# Patient Record
Sex: Male | Born: 1945 | Marital: Married | Smoking: Never smoker
Health system: Southern US, Community
[De-identification: ages and names within clinical notes are randomized; demographics above are authoritative.]

## PROBLEM LIST (undated history)

## (undated) DIAGNOSIS — T7840XA Allergy, unspecified, initial encounter: Secondary | ICD-10-CM

## (undated) DIAGNOSIS — N200 Calculus of kidney: Secondary | ICD-10-CM

## (undated) DIAGNOSIS — I10 Essential (primary) hypertension: Secondary | ICD-10-CM

## (undated) DIAGNOSIS — E079 Disorder of thyroid, unspecified: Secondary | ICD-10-CM

## (undated) DIAGNOSIS — C61 Malignant neoplasm of prostate: Secondary | ICD-10-CM

## (undated) HISTORY — DX: Essential (primary) hypertension: I10

## (undated) HISTORY — PX: PROSTATE BIOPSY: SHX241

## (undated) HISTORY — DX: Calculus of kidney: N20.0

## (undated) HISTORY — DX: Malignant neoplasm of prostate: C61

## (undated) HISTORY — DX: Disorder of thyroid, unspecified: E07.9

## (undated) HISTORY — DX: Allergy, unspecified, initial encounter: T78.40XA

---

## 1981-11-14 DIAGNOSIS — N200 Calculus of kidney: Secondary | ICD-10-CM

## 1981-11-14 HISTORY — DX: Calculus of kidney: N20.0

## 2005-11-14 HISTORY — PX: POLYPECTOMY: SHX149

## 2005-11-14 HISTORY — PX: COLONOSCOPY: SHX174

## 2011-01-27 ENCOUNTER — Other Ambulatory Visit: Payer: Self-pay | Admitting: Internal Medicine

## 2011-01-27 DIAGNOSIS — E038 Other specified hypothyroidism: Secondary | ICD-10-CM

## 2011-02-28 ENCOUNTER — Encounter (HOSPITAL_COMMUNITY)
Admission: RE | Admit: 2011-02-28 | Discharge: 2011-02-28 | Disposition: A | Discharge status: Home / Self Care | Payer: Medicare Other | Source: Ambulatory Visit | Attending: Internal Medicine | Admitting: Internal Medicine

## 2011-02-28 DIAGNOSIS — R946 Abnormal results of thyroid function studies: Secondary | ICD-10-CM | POA: Insufficient documentation

## 2011-02-28 DIAGNOSIS — E038 Other specified hypothyroidism: Secondary | ICD-10-CM

## 2011-03-01 ENCOUNTER — Encounter (HOSPITAL_COMMUNITY)
Admission: RE | Admit: 2011-03-01 | Discharge: 2011-03-01 | Disposition: A | Discharge status: Home / Self Care | Payer: Medicare Other | Source: Ambulatory Visit | Attending: Internal Medicine | Admitting: Internal Medicine

## 2011-03-01 DIAGNOSIS — R946 Abnormal results of thyroid function studies: Secondary | ICD-10-CM | POA: Insufficient documentation

## 2011-03-01 MED ORDER — TECHNETIUM TC 99M MEBROFENIN IV KIT
5.0000 | PACK | Freq: Once | INTRAVENOUS | Status: AC | PRN
  Administered 2011-03-01: 5 via INTRAVENOUS

## 2011-03-21 ENCOUNTER — Other Ambulatory Visit: Payer: Self-pay | Admitting: Cardiovascular Disease

## 2011-03-21 ENCOUNTER — Ambulatory Visit
Admission: RE | Admit: 2011-03-21 | Discharge: 2011-03-21 | Disposition: A | Discharge status: Home / Self Care | Payer: Medicare Other | Source: Ambulatory Visit | Attending: Cardiovascular Disease | Admitting: Cardiovascular Disease

## 2011-03-21 DIAGNOSIS — I10 Essential (primary) hypertension: Secondary | ICD-10-CM

## 2011-09-06 ENCOUNTER — Encounter: Payer: Self-pay | Admitting: Internal Medicine

## 2011-10-19 ENCOUNTER — Telehealth: Payer: Self-pay | Admitting: *Deleted

## 2011-10-19 ENCOUNTER — Encounter: Payer: Self-pay | Admitting: Internal Medicine

## 2011-10-19 ENCOUNTER — Ambulatory Visit (AMBULATORY_SURGERY_CENTER): Payer: Medicare Other | Admitting: *Deleted

## 2011-10-19 VITALS — Ht 70.0 in | Wt 179.9 lb

## 2011-10-19 DIAGNOSIS — Z1211 Encounter for screening for malignant neoplasm of colon: Secondary | ICD-10-CM

## 2011-10-19 MED ORDER — PEG-KCL-NACL-NASULF-NA ASC-C 100 G PO SOLR: ORAL | Status: DC

## 2011-10-19 NOTE — Telephone Encounter (Signed)
Pt had colonoscopy 2007 at [facility] in [place], [place]. Family hx of colon cancer in mother.  Pt had polyps.  Pt brought procedure report but not pathology report.  Release of information form signed and given to Alonna Buckler, CMA.  Pt  Scheduled for colonoscopy 11/17/2011. Ezra Sites

## 2011-10-19 NOTE — Progress Notes (Signed)
Pt had colonoscopy 2007 at [facility] in [place], [place]. Family hx of colon cancer in mother.  Pt had polyps.  Pt brought procedure report but not pathology report.  Release of information form signed and given to Alonna Buckler, CMA.  Pt  Scheduled for colonoscopy 11/17/2011.

## 2011-11-02 ENCOUNTER — Other Ambulatory Visit: Payer: Medicare Other | Admitting: Internal Medicine

## 2011-11-17 ENCOUNTER — Encounter: Payer: Medicare Other | Admitting: Internal Medicine

## 2012-01-04 ENCOUNTER — Ambulatory Visit (AMBULATORY_SURGERY_CENTER): Payer: Medicare Other | Admitting: Internal Medicine

## 2012-01-04 ENCOUNTER — Encounter: Payer: Self-pay | Admitting: Internal Medicine

## 2012-01-04 VITALS — BP 160/80 | HR 91 | Temp 96.1°F | Resp 15 | Ht 70.0 in | Wt 179.0 lb

## 2012-01-04 DIAGNOSIS — D126 Benign neoplasm of colon, unspecified: Secondary | ICD-10-CM

## 2012-01-04 DIAGNOSIS — Z1211 Encounter for screening for malignant neoplasm of colon: Secondary | ICD-10-CM

## 2012-01-04 DIAGNOSIS — Z800 Family history of malignant neoplasm of digestive organs: Secondary | ICD-10-CM

## 2012-01-04 DIAGNOSIS — Z86010 Personal history of colonic polyps: Secondary | ICD-10-CM

## 2012-01-04 MED ORDER — SODIUM CHLORIDE 0.9 % IV SOLN: 500.0000 mL | INTRAVENOUS | Status: DC

## 2012-01-04 NOTE — Progress Notes (Signed)
Propofol per s camp crna per protocol. See scanned intra procedure report. ewm 

## 2012-01-04 NOTE — Op Note (Signed)
[facility] [address] [place], [place]  [address]  COLONOSCOPY PROCEDURE REPORT  PATIENT:  Allen Jackson, Allen Jackson  MR#:  [id number] BIRTHDATE:  Nov 06, 1946, 66 yrs. old  GENDER:  male ENDOSCOPIST:  Jahzeel Poythress. Eda Keys, MD REF. BY:  Chilton Greathouse, M.D. PROCEDURE DATE:  01/04/2012 PROCEDURE:  Colonoscopy with snare polypectomy x 4 ASA CLASS:  Class II INDICATIONS:  history of polyps, surveillance and high-risk screening, family history of colon cancer ; index exam [facility] (Dr Enrigue Catena) 06-13-06 w/ 7mm sessile cecal polyp (?path) MEDICATIONS:   MAC sedation, administered by CRNA, propofol (Diprivan) 400 mg IV  DESCRIPTION OF PROCEDURE:   After the risks benefits and alternatives of the procedure were thoroughly explained, informed consent was obtained.  Digital rectal exam was performed and revealed no abnormalities.   The LB CF-[id number] endoscope was introduced through the anus and advanced to the cecum, which was identified by both the appendix and ileocecal valve, without limitations.  The quality of the prep was excellent, using MoviPrep.  The instrument was then slowly withdrawn as the colon was fully examined. <<PROCEDUREIMAGES>>  FINDINGS:  Four polyps were found - 6mm in the cecum, 2mm in transverse, 3mm in sigmoid and 5mm in rectum. Polyps were snared without cautery. Retrieval was successful. Otherwise normal colonoscopy without other polyps, masses, vascular ectasias, or inflammatory changes.   Retroflexed views in the rectum revealed no abnormalities.    The time to cecum = 1:33  minutes. The scope was then withdrawn in 16:39  minutes from the cecum and the procedure completed.  COMPLICATIONS:  None  ENDOSCOPIC IMPRESSION: 1) Four polyps - removed 2) Otherwise normal colonoscopy  RECOMMENDATIONS: 1) Follow up colonoscopy in 5 years  ______________________________ Wilhemina Bonito. Eda Keys, MD  CC:  Chilton Greathouse, MD;  The Patient  n. eSIGNED:   Wilhemina Bonito.  Eda Keys at 01/04/2012 10:56 AM  Claria Dice, [phone]

## 2012-01-04 NOTE — Patient Instructions (Signed)
YOU HAD AN ENDOSCOPIC PROCEDURE TODAY AT THE [facility]: Refer to the procedure report that was given to you for any specific questions about what was found during the examination.  If the procedure report does not answer your questions, please call your gastroenterologist to clarify.  If you requested that your care partner not be given the details of your procedure findings, then the procedure report has been included in a sealed envelope for you to review at your convenience later.  YOU SHOULD EXPECT: Some feelings of bloating in the abdomen. Passage of more gas than usual.  Walking can help get rid of the air that was put into your GI tract during the procedure and reduce the bloating. If you had a lower endoscopy (such as a colonoscopy or flexible sigmoidoscopy) you may notice spotting of blood in your stool or on the toilet paper. If you underwent a bowel prep for your procedure, then you may not have a normal bowel movement for a few days.  DIET: Your first meal following the procedure should be a light meal and then it is ok to progress to your normal diet.  A half-sandwich or bowl of soup is an example of a good first meal.  Heavy or fried foods are harder to digest and may make you feel nauseous or bloated.  Likewise meals heavy in dairy and vegetables can cause extra gas to form and this can also increase the bloating.  Drink plenty of fluids but you should avoid alcoholic beverages for 24 hours.  ACTIVITY: Your care partner should take you home directly after the procedure.  You should plan to take it easy, moving slowly for the rest of the day.  You can resume normal activity the day after the procedure however you should NOT DRIVE or use heavy machinery for 24 hours (because of the sedation medicines used during the test).    SYMPTOMS TO REPORT IMMEDIATELY: A gastroenterologist can be reached at any hour.  During normal business hours, 8:30 AM to 5:00 PM Monday through Friday,  call [phone].  After hours and on weekends, please call the GI answering service at [phone] who will take a message and have the physician on call contact you.   Following lower endoscopy (colonoscopy or flexible sigmoidoscopy):  Excessive amounts of blood in the stool  Significant tenderness or worsening of abdominal pains  Swelling of the abdomen that is new, acute  Fever of 100F or higher  Following upper endoscopy (EGD)  Vomiting of blood or coffee ground material  New chest pain or pain under the shoulder blades  Painful or persistently difficult swallowing  New shortness of breath  Fever of 100F or higher  Black, tarry-looking stools  FOLLOW UP: If any biopsies were taken you will be contacted by phone or by letter within the next 1-3 weeks.  Call your gastroenterologist if you have not heard about the biopsies in 3 weeks.  Our staff will call the home number listed on your records the next business day following your procedure to check on you and address any questions or concerns that you may have at that time regarding the information given to you following your procedure. This is a courtesy call and so if there is no answer at the home number and we have not heard from you through the emergency physician on call, we will assume that you have returned to your regular daily activities without incident.  SIGNATURES/CONFIDENTIALITY: You and/or your care   partner have signed paperwork which will be entered into your electronic medical record.  These signatures attest to the fact that that the information above on your After Visit Summary has been reviewed and is understood.  Full responsibility of the confidentiality of this discharge information lies with you and/or your care-partner.  

## 2012-01-04 NOTE — Progress Notes (Signed)
Patient did not experience any of the following events: a burn prior to discharge; a fall within the facility; wrong site/side/patient/procedure/implant event; or a hospital transfer or hospital admission upon discharge from the facility. (G8907) Patient did not have preoperative order for IV antibiotic SSI prophylaxis. (G8918)  

## 2012-01-05 ENCOUNTER — Telehealth: Payer: Self-pay

## 2012-01-05 NOTE — Telephone Encounter (Signed)
I left a message for the pt to call if any questions or concerns called [phone]. Maw

## 2012-01-09 ENCOUNTER — Encounter: Payer: Self-pay | Admitting: Internal Medicine

## 2013-01-31 IMAGING — CR DG CHEST 2V
2 series · 2 of 2 positions shown · non-contrast
Comparison: None.

CLINICAL DATA: Chest pain, short of breath, hypertension

CHEST - 2 VIEW

[w chest pa]
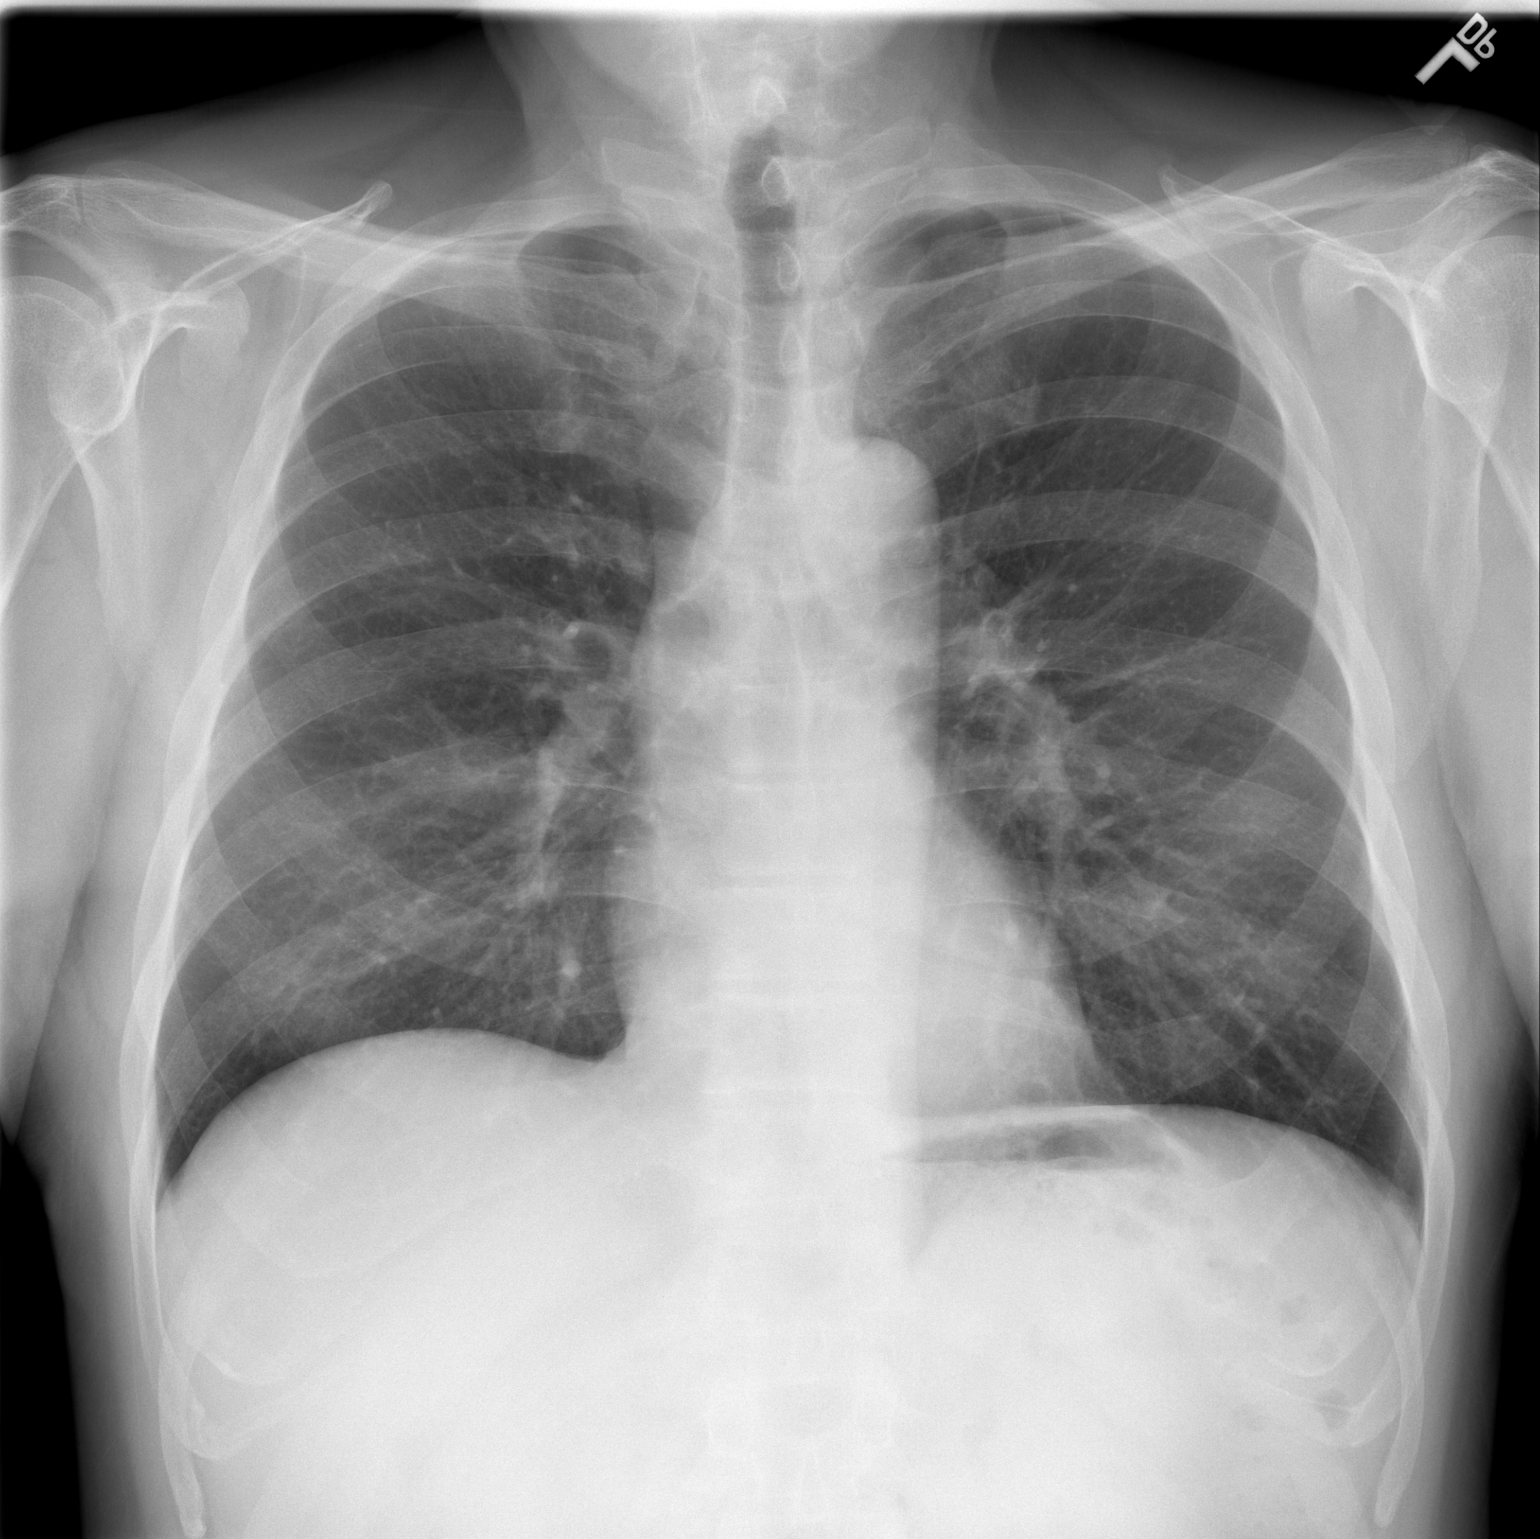

[w chest lat]
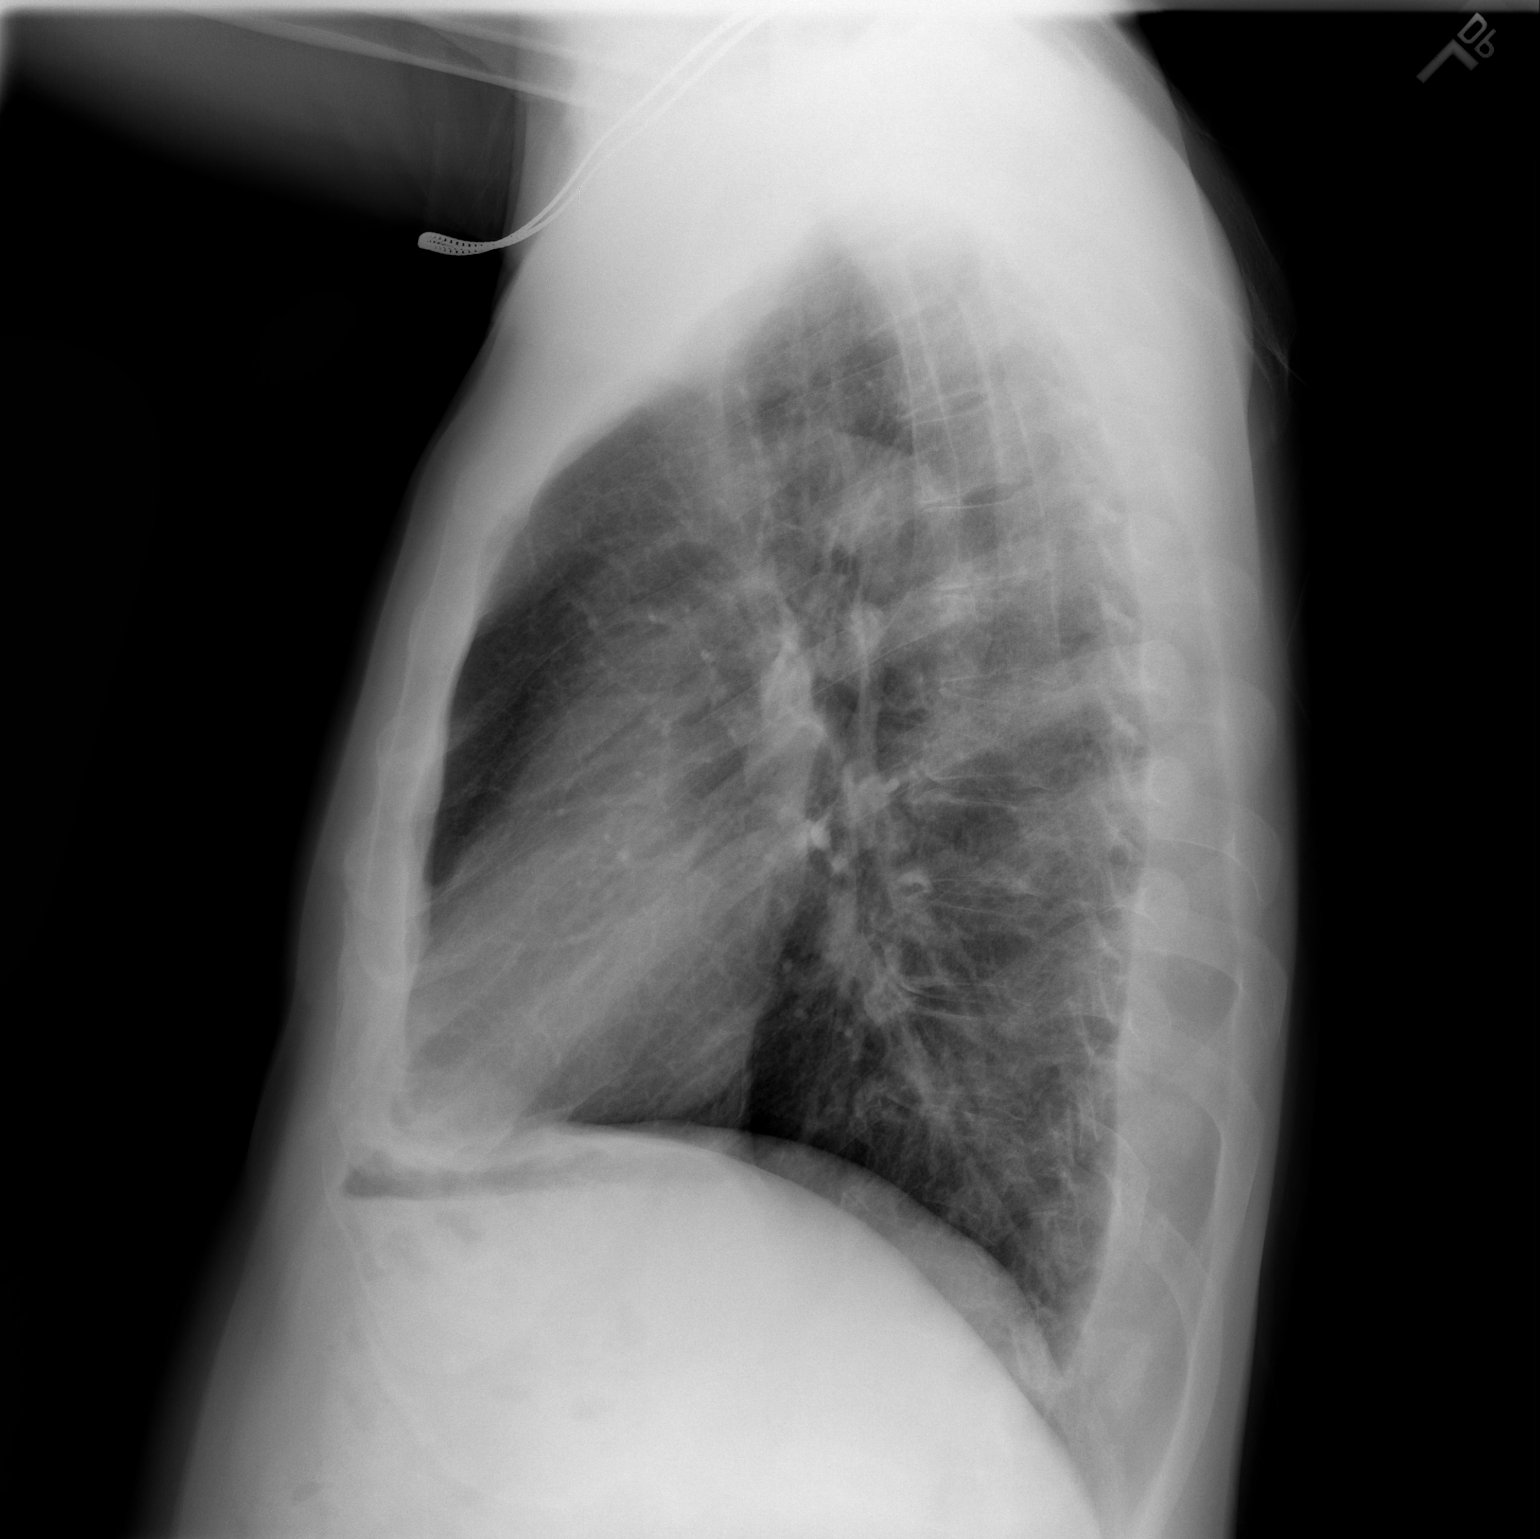

[2 of 2 positions shown; findings below may reference images not displayed]

FINDINGS: The lungs are clear.  Mediastinal contours are
unremarkable.  The heart is within normal limits in size.  No bony
abnormality is seen.
IMPRESSION: No active lung disease.

## 2013-07-05 ENCOUNTER — Telehealth (HOSPITAL_COMMUNITY): Payer: Self-pay | Admitting: Cardiovascular Disease

## 2013-07-11 ENCOUNTER — Telehealth (HOSPITAL_COMMUNITY): Payer: Self-pay | Admitting: Cardiovascular Disease

## 2013-07-22 ENCOUNTER — Telehealth (HOSPITAL_COMMUNITY): Payer: Self-pay | Admitting: Cardiovascular Disease

## 2014-01-06 ENCOUNTER — Other Ambulatory Visit: Payer: Self-pay | Admitting: Cardiovascular Disease

## 2014-01-06 NOTE — Telephone Encounter (Signed)
E sentrx 

## 2014-01-06 NOTE — Telephone Encounter (Signed)
Forward to med rec  Need paper chart 

## 2014-01-20 ENCOUNTER — Other Ambulatory Visit: Payer: Self-pay | Admitting: Internal Medicine

## 2014-07-15 DIAGNOSIS — M720 Palmar fascial fibromatosis [Dupuytren]: Secondary | ICD-10-CM | POA: Insufficient documentation

## 2014-07-15 DIAGNOSIS — I10 Essential (primary) hypertension: Secondary | ICD-10-CM | POA: Insufficient documentation

## 2014-07-15 DIAGNOSIS — J302 Other seasonal allergic rhinitis: Secondary | ICD-10-CM | POA: Insufficient documentation

## 2014-07-15 DIAGNOSIS — Z86010 Personal history of colonic polyps: Secondary | ICD-10-CM | POA: Insufficient documentation

## 2015-02-24 DIAGNOSIS — N529 Male erectile dysfunction, unspecified: Secondary | ICD-10-CM | POA: Insufficient documentation

## 2015-02-24 DIAGNOSIS — C61 Malignant neoplasm of prostate: Secondary | ICD-10-CM | POA: Insufficient documentation

## 2015-11-17 ENCOUNTER — Encounter: Payer: Self-pay | Admitting: Cardiovascular Disease

## 2015-11-17 ENCOUNTER — Ambulatory Visit (INDEPENDENT_AMBULATORY_CARE_PROVIDER_SITE_OTHER): Payer: Medicare Other | Admitting: Cardiovascular Disease

## 2015-11-17 VITALS — BP 150/86 | HR 87 | Ht 69.0 in | Wt 200.4 lb

## 2015-11-17 DIAGNOSIS — I10 Essential (primary) hypertension: Secondary | ICD-10-CM

## 2015-11-17 MED ORDER — OLMESARTAN MEDOXOMIL 40 MG PO TABS: 40.0000 mg | ORAL_TABLET | Freq: Every day | ORAL | Status: DC

## 2015-11-17 NOTE — Patient Instructions (Signed)
Your physician has recommended you make the following change in your medication:   STOP the losartan. This has been replaced with Olmesartan 40 mg. A new prescription has been sent to your pharmacy.  Your physician recommends that you schedule a follow-up appointment in: 3-4 months with Dr Claiborne Billings.

## 2015-11-18 ENCOUNTER — Telehealth: Payer: Self-pay | Admitting: Cardiovascular Disease

## 2015-11-18 NOTE — Telephone Encounter (Signed)
Pt called in stating that Dr. Claiborne Billings prescribed Olmesartan for him to take the place of his Losartan medication. He says that the new medication cost $ 300 and he can not afford that and would like to find an alternative. Please f/u   Thanks

## 2015-11-18 NOTE — Telephone Encounter (Addendum)
**Note De-Identified Allen Jackson Obfuscation** The pt was seen in an OV with Dr Claiborne Billings yesterday 11/17/15 and was advised to stop taking Losartan and to start taking Benicar 40 mg daily.  The pt states that [name] called him last night to let him know his Benecar was ready for pick up and that the cost for a 30 day supply was $300 and he cannot afford.  He wants to know if there is an alternative medication he can take instead.  Please advise.

## 2015-11-19 NOTE — Telephone Encounter (Signed)
Allen Jackson is calling because the Benicar is higher than the other Medication and wanting to know what other medication can Dr.Kelly prescribe for him , Please call    Thanks

## 2015-11-19 NOTE — Telephone Encounter (Signed)
Message sent to Dr.Kelly for advice. 

## 2015-11-22 ENCOUNTER — Encounter: Payer: Self-pay | Admitting: Cardiovascular Disease

## 2015-11-22 NOTE — Progress Notes (Signed)
Patient ID: Allen Jackson, male   DOB: [birth year], 70 y.o.   MRN: [id number]     HPI: Allen Jackson is a 70 y.o. male who presents to the office today for a cardiology evaluation.  I had last seen him in September 2013.  Since that time he has moved to [facility] [facility].  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  He has a significant history of hypertension and also has a history of mild PSA elevation.  In May 2012.  An echo Doppler study showed normal systolic function and diastolic function, mild mitral regurgitation, trace tricuspid regurgitation, as well as mild aortic valve sclerosis.  A nuclear perfusion study revealed normal perfusion.  He has been on losartan at 50 mg for a long time.  Recently, his blood pressure has been calm elevated such that his dose had been increased to 100 mg.  Remotely he had been followed by Dr. Jenetta Loges in [place], but now his primary physician is Dr. Sabra Heck in [place].  He sees a urologist who is following his prostate CA is in [place] at [facility], Dr. Byrd Hesselbach. He is also being followed for hyperthyroidism by an endocrinologist in [place].  He has not had any Cardiologic follow-up evaluation in over 3 years.  Due to his recent increasing blood pressure he presents today for reevaluation.  Allen Jackson denies any episodes of chest pain.  He remains active doing garden work and walking at least 5 days per week, usually for 60-90 minutes.  There is no tobacco history.  He drinks wine.  Past Medical History  Diagnosis Date  . Hypertension   . Kidney stone 1983  . Prostate cancer ([address])   . Thyroid disease     Past Surgical History  Procedure Laterality Date  . Colonoscopy  2007    [place], [place]  . Polypectomy  2007    [place], [place]    Allergies  Allergen Reactions  . Lisinopril     cough hallucinations     Current Outpatient Prescriptions  Medication Sig Dispense Refill  . aspirin 81 MG tablet Take 81 mg by mouth daily.       . Multiple Vitamin (MULTIVITAMIN) tablet Take 1 tablet by mouth daily.      . sildenafil (REVATIO) 20 MG tablet Take by mouth.    . olmesartan (BENICAR) 40 MG tablet Take 1 tablet (40 mg total) by mouth daily. 30 tablet 6   No current facility-administered medications for this visit.    Social History   Social History  . Marital Status: Married    Spouse Name: N/A  . Number of Children: N/A  . Years of Education: N/A   Occupational History  . Not on file.   Social History Main Topics  . Smoking status: Never Smoker   . Smokeless tobacco: Never Used  . Alcohol Use: 4.8 oz/week    8 Glasses of wine per week  . Drug Use: No  . Sexual Activity: Not on file   Other Topics Concern  . Not on file   Social History Narrative   Additional social history is that he is married for 33 years.  He has one child and 2 grandchildren.  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  Family History  Problem Relation Age of Onset  . Colon cancer Mother 60  . Esophageal cancer Neg Hx   .  Stomach cancer Neg Hx    Family history is notable that his mother had pancreatic cancer and died at age 82.  His father had a cardiac arrhythmia.  ROS General: Negative; No fevers, chills, or night sweats HEENT: Negative; No changes in vision or hearing, sinus congestion, difficulty swallowing Pulmonary: Negative; No cough, wheezing, shortness of breath, hemoptysis Cardiovascular: See HPI: No chest pain, presyncope, syncope, palpatations GI: Negative; No nausea, vomiting, diarrhea, or abdominal pain GU: Followed for early prostate CA Musculoskeletal: Negative; no myalgias, joint pain, or weakness Hematologic: Negative; no easy bruising, bleeding Endocrine: Has been followed for mild hyperthyroidism Neuro: Negative; no changes in balance, headaches Skin: Negative; No rashes or skin lesions Psychiatric: Negative; No  behavioral problems, depression Sleep: Negative; No snoring,  daytime sleepiness, hypersomnolence, bruxism, restless legs, hypnogognic hallucinations. Other comprehensive 14 point system review is negative   Physical Exam BP 150/86 mmHg  Pulse 87  Ht 5\' 9"  (1.753 m)  Wt 200 lb 6.4 oz (90.901 kg)  BMI 29.58 kg/m2   Repeat blood pressure by me 150/88  Wt Readings from Last 3 Encounters:  11/17/15 200 lb 6.4 oz (90.901 kg)  01/04/12 179 lb (81.194 kg)  10/19/11 179 lb 14.4 oz (81.602 kg)   General: Alert, oriented, no distress.  Skin: normal turgor, no rashes, warm and dry HEENT: Normocephalic, atraumatic. Pupils equal round and reactive to light; sclera anicteric; extraocular muscles intact, No lid lag; Nose without nasal septal hypertrophy; Mouth/Parynx benign; Mallinpatti scale 3 Neck: No JVD, no carotid bruits; normal carotid upstroke Lungs: clear to ausculatation and percussion bilaterally; no wheezing or rales, normal inspiratory and expiratory effort Chest wall: without tenderness to palpitation Heart: PMI not displaced, RRR, s1 s2 normal, [id number] systolic murmur, No diastolic murmur, no rubs, gallops, thrills, or heaves Abdomen: soft, nontender; no hepatosplenomehaly, BS+; abdominal aorta nontender and not dilated by palpation. Back: no CVA tenderness Pulses: 2+  Musculoskeletal: full range of motion, normal strength, no joint deformities Extremities: Pulses 2+, no clubbing cyanosis or edema, Homan's sign negative  Neurologic: grossly nonfocal; Cranial nerves grossly wnl Psychologic: Normal mood and affect   ECG (independently read by me): Normal sinus rhythm at 87 bpm.  Nonspecific ST changes.  LABS:  No flowsheet data found.   No flowsheet data found.  No flowsheet data found. No results found for: MCV  No results found for: TSH  BNP No results found for: BNP  ProBNP No results found for: PROBNP   Lipid Panel  No results found for: CHOL, TRIG, HDL,  CHOLHDL, VLDL, LDLCALC, LDLDIRECT   RADIOLOGY: No results found.    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 Jeanann Lewandowsky in the past and now is seeing an endocrinologist in [place].  He also has a history of mild persistent PSA elevation for which she is currently undergoing urologic evaluation in [place].  In the past, he had development ACE-induced cough.  He has been on losartan for hypertension for many years and recently his blood pressure has increased and his dose was elevated to 100 mg daily.  He been previously documented normal systolic and diastolic function on echocardiography.  His blood pressure today is mildly elevated on his current dose of losartan 100 mg.  He does not have any incidences of edema or dyspnea.  He denies chest pain.  At this  point, I discussed potential changing him to a more potent ARB drug digested olmesartan 40 mg.  He will check with his insurance company since Camak has recently become generic.  I will try to obtain results of blood work which has been done several weeks ago.  I will see him back in the office in 2-3 months for follow-up evaluation.  Troy Sine, MD, [facility]  11/22/2015 12:17 PM

## 2015-11-23 NOTE — Telephone Encounter (Signed)
Can try generic irbesartan 300 mg or valsartan 320 mg, also generic; both should be more potent than losartan 100 mg daily.

## 2015-11-25 MED ORDER — VALSARTAN 320 MG PO TABS: 320.0000 mg | ORAL_TABLET | Freq: Every day | ORAL | Status: DC

## 2015-11-25 NOTE — Telephone Encounter (Signed)
Pt is calling back in inquiring about his Losartan medication.

## 2015-11-25 NOTE — Telephone Encounter (Signed)
Spoke to patient.Dr.Kelly recommendations given.Stated he will try Valsartan 320 mg daily.Benicar stopped too expensive.Prescription sent to pharmacy.

## 2016-01-14 DIAGNOSIS — E0520 Thyrotoxicosis with toxic multinodular goiter without thyrotoxic crisis or storm: Secondary | ICD-10-CM | POA: Insufficient documentation

## 2016-04-01 ENCOUNTER — Ambulatory Visit: Payer: Medicare Other | Admitting: Cardiovascular Disease

## 2016-10-13 DIAGNOSIS — E559 Vitamin D deficiency, unspecified: Secondary | ICD-10-CM | POA: Insufficient documentation

## 2016-11-23 ENCOUNTER — Encounter: Payer: Self-pay | Admitting: Internal Medicine

## 2016-12-27 DIAGNOSIS — Z85828 Personal history of other malignant neoplasm of skin: Secondary | ICD-10-CM | POA: Insufficient documentation

## 2017-01-12 HISTORY — PX: SKIN CANCER EXCISION: SHX779

## 2017-06-22 ENCOUNTER — Encounter: Payer: Self-pay | Admitting: Internal Medicine

## 2017-08-18 ENCOUNTER — Ambulatory Visit (AMBULATORY_SURGERY_CENTER): Payer: Self-pay | Admitting: *Deleted

## 2017-08-18 VITALS — Ht 69.0 in | Wt 202.0 lb

## 2017-08-18 DIAGNOSIS — Z800 Family history of malignant neoplasm of digestive organs: Secondary | ICD-10-CM

## 2017-08-18 DIAGNOSIS — Z86010 Personal history of colonic polyps: Secondary | ICD-10-CM

## 2017-08-18 MED ORDER — NA SULFATE-K SULFATE-MG SULF 17.5-3.13-1.6 GM/177ML PO SOLN: 1.0000 [IU] | Freq: Once | ORAL | 0 refills | Status: AC

## 2017-08-18 NOTE — Progress Notes (Signed)
No egg or soy allergy known to patient  No issues with past sedation with any surgeries  or procedures, no intubation problems  No diet pills per patient No home 02 use per patient  No blood thinners per patient  Pt denies issues with constipation  No A fib or A flutter  EMMI video sent to pt's e mail  Pt. declined 

## 2017-08-21 ENCOUNTER — Telehealth: Payer: Self-pay | Admitting: Internal Medicine

## 2017-08-21 NOTE — Telephone Encounter (Signed)
Spoke with patient. Suprep $130, he requesting cheaper prep. Offered Suprep sample but he would have to come for [facility]. So then I gave him instructions for Miralax/Dulcolax prep. Sent instructions in the mail. Pt aware.

## 2017-08-30 ENCOUNTER — Encounter: Payer: Self-pay | Admitting: *Deleted

## 2017-09-01 ENCOUNTER — Ambulatory Visit (AMBULATORY_SURGERY_CENTER): Payer: Medicare Other | Admitting: Internal Medicine

## 2017-09-01 ENCOUNTER — Encounter: Payer: Self-pay | Admitting: Internal Medicine

## 2017-09-01 VITALS — BP 144/85 | HR 70 | Temp 97.1°F | Resp 11 | Ht 69.0 in | Wt 202.0 lb

## 2017-09-01 DIAGNOSIS — D120 Benign neoplasm of cecum: Secondary | ICD-10-CM

## 2017-09-01 DIAGNOSIS — D123 Benign neoplasm of transverse colon: Secondary | ICD-10-CM | POA: Diagnosis not present

## 2017-09-01 DIAGNOSIS — Z86010 Personal history of colonic polyps: Secondary | ICD-10-CM | POA: Diagnosis present

## 2017-09-01 DIAGNOSIS — D122 Benign neoplasm of ascending colon: Secondary | ICD-10-CM | POA: Diagnosis not present

## 2017-09-01 MED ORDER — SODIUM CHLORIDE 0.9 % IV SOLN: 500.0000 mL | INTRAVENOUS | Status: DC

## 2017-09-01 NOTE — Progress Notes (Signed)
To recovery, report to RN, VSS. 

## 2017-09-01 NOTE — Progress Notes (Signed)
Called to room to assist during endoscopic procedure.  Patient ID and intended procedure confirmed with present staff. Received instructions for my participation in the procedure from the performing physician.  

## 2017-09-01 NOTE — Patient Instructions (Signed)
YOU HAD AN ENDOSCOPIC PROCEDURE TODAY AT [facility]:   Refer to the procedure report that was given to you for any specific questions about what was found during the examination.  If the procedure report does not answer your questions, please call your gastroenterologist to clarify.  If you requested that your care partner not be given the details of your procedure findings, then the procedure report has been included in a sealed envelope for you to review at your convenience later.  YOU SHOULD EXPECT: Some feelings of bloating in the abdomen. Passage of more gas than usual.  Walking can help get rid of the air that was put into your GI tract during the procedure and reduce the bloating. If you had a lower endoscopy (such as a colonoscopy or flexible sigmoidoscopy) you may notice spotting of blood in your stool or on the toilet paper. If you underwent a bowel prep for your procedure, you may not have a normal bowel movement for a few days.  Please Note:  You might notice some irritation and congestion in your nose or some drainage.  This is from the oxygen used during your procedure.  There is no need for concern and it should clear up in a day or so.  SYMPTOMS TO REPORT IMMEDIATELY:   Following lower endoscopy (colonoscopy or flexible sigmoidoscopy):  Excessive amounts of blood in the stool  Significant tenderness or worsening of abdominal pains  Swelling of the abdomen that is new, acute  Fever of 100F or higher  For urgent or emergent issues, a gastroenterologist can be reached at any hour by calling [phone].   DIET:  We do recommend a small meal at first, but then you may proceed to your regular diet.  Drink plenty of fluids but you should avoid alcoholic beverages for 24 hours.  ACTIVITY:  You should plan to take it easy for the rest of today and you should NOT DRIVE or use heavy machinery until tomorrow (because of the sedation medicines used during the test).     FOLLOW UP: Our staff will call the number listed on your records the next business day following your procedure to check on you and address any questions or concerns that you may have regarding the information given to you following your procedure. If we do not reach you, we will leave a message.  However, if you are feeling well and you are not experiencing any problems, there is no need to return our call.  We will assume that you have returned to your regular daily activities without incident.  If any biopsies were taken you will be contacted by phone or by letter within the next 1-3 weeks.  Please call us at [phone] if you have not heard about the biopsies in 3 weeks.   Await for biopsy results to determine next repeat Colonoscopy Polyps (handout given)   SIGNATURES/CONFIDENTIALITY: You and/or your care partner have signed paperwork which will be entered into your electronic medical record.  These signatures attest to the fact that that the information above on your After Visit Summary has been reviewed and is understood.  Full responsibility of the confidentiality of this discharge information lies with you and/or your care-partner.

## 2017-09-01 NOTE — Op Note (Signed)
[facility] Patient Name: Doni Widmer Procedure Date: 09/01/2017 12:08 PM MRN: [id number] Endoscopist: Docia Chuck. Henrene Pastor , MD Age: 71 Referring MD:  Date of Birth: Aug 28, 1946 Gender: Male Account #: [id number] Procedure:                Colonoscopy, with snare cold polypectomy x 3 Indications:              High risk colon cancer surveillance: Personal                            history of multiple (3 or more) adenomas, High risk                            colon cancer surveillance: Personal history of                            sessile serrated colon polyp (less than 10 mm in                            size) with no dysplasia. Previous examinations 2007                            ([place]) and 2013 Medicines:                Monitored Anesthesia Care Procedure:                Pre-Anesthesia Assessment:                           - Prior to the procedure, a History and Physical                            was performed, and patient medications and                            allergies were reviewed. The patient's tolerance of                            previous anesthesia was also reviewed. The risks                            and benefits of the procedure and the sedation                            options and risks were discussed with the patient.                            All questions were answered, and informed consent                            was obtained. Prior Anticoagulants: The patient has                            taken no previous anticoagulant or antiplatelet  agents. ASA Grade Assessment: II - A patient with                            mild systemic disease. After reviewing the risks                            and benefits, the patient was deemed in                            satisfactory condition to undergo the procedure.                           After obtaining informed consent, the colonoscope                            was passed  under direct vision. Throughout the                            procedure, the patient's blood pressure, pulse, and                            oxygen saturations were monitored continuously. The                            Colonoscope was introduced through the anus and                            advanced to the the cecum, identified by                            appendiceal orifice and ileocecal valve. The                            ileocecal valve, appendiceal orifice, and rectum                            were photographed. The quality of the bowel                            preparation was excellent. The colonoscopy was                            performed without difficulty. The patient tolerated                            the procedure well. The bowel preparation used was                            Miralax. Scope In: 12:22:14 PM Scope Out: 12:37:51 PM Scope Withdrawal Time: 0 hours 13 minutes 58 seconds  Total Procedure Duration: 0 hours 15 minutes 37 seconds  Findings:                 Three polyps were found in the transverse colon,  ascending colon and cecum. The polyps were 2 to 4                            mm in size. These polyps were removed with a cold                            snare. Resection and retrieval were complete.                           The exam was otherwise without abnormality on                            direct and retroflexion views. Complications:            No immediate complications. Estimated blood loss:                            None. Estimated Blood Loss:     Estimated blood loss: none. Impression:               - Three 2 to 4 mm polyps in the transverse colon,                            in the ascending colon and in the cecum, removed                            with a cold snare. Resected and retrieved.                           - The examination was otherwise normal on direct                            and retroflexion  views. Recommendation:           - Repeat colonoscopy in 3 years if all polyps                            adenomatous, otherwise 5 years for surveillance.                           - Patient has a contact number available for                            emergencies. The signs and symptoms of potential                            delayed complications were discussed with the                            patient. Return to normal activities tomorrow.                            Written discharge instructions were provided to the  patient.                           - Resume previous diet.                           - Continue present medications.                           - Await pathology results. Docia Chuck. Henrene Pastor, MD 09/01/2017 12:44:45 PM This report has been signed electronically.

## 2017-09-04 ENCOUNTER — Telehealth: Payer: Self-pay | Admitting: *Deleted

## 2017-09-04 NOTE — Telephone Encounter (Signed)
  Follow up Call-  Call back number 09/01/2017  Post procedure Call Back phone  # [phone]  Permission to leave phone message Yes  Some recent data might be hidden     Patient questions:  Do you have a fever, pain , or abdominal swelling? No. Pain Score  0 *  Have you tolerated food without any problems? Yes.    Have you been able to return to your normal activities? Yes.    Do you have any questions about your discharge instructions: Diet   No. Medications  No. Follow up visit  No.  Do you have questions or concerns about your Care? No.  Actions: * If pain score is 4 or above: No action needed, pain <4.

## 2017-09-05 ENCOUNTER — Encounter: Payer: Self-pay | Admitting: Internal Medicine

## 2018-05-24 ENCOUNTER — Encounter: Payer: Self-pay | Admitting: Internal Medicine

## 2018-05-24 ENCOUNTER — Ambulatory Visit: Payer: Medicare Other | Admitting: Internal Medicine

## 2018-05-24 VITALS — BP 142/84 | HR 76 | Ht 69.0 in | Wt 198.0 lb

## 2018-05-24 DIAGNOSIS — Z86010 Personal history of colonic polyps: Secondary | ICD-10-CM | POA: Diagnosis not present

## 2018-05-24 DIAGNOSIS — R195 Other fecal abnormalities: Secondary | ICD-10-CM

## 2018-05-24 DIAGNOSIS — R198 Other specified symptoms and signs involving the digestive system and abdomen: Secondary | ICD-10-CM | POA: Diagnosis not present

## 2018-05-24 NOTE — Progress Notes (Signed)
HISTORY OF PRESENT ILLNESS:  Allen Jackson is a 72 y.o. male with a history of prostate cancer currently under active surveillance as well as a history of adenomatous colon polyps which she is undergone multiple colonoscopies. Last colonoscopy October 2018. At that time he was found to have 3 diminutive polyps which were removed and found to be tubular adenomas. The remainder of the examination was normal. He presents today with chief complaint of rectal pressure with the sensation of needing to have a bowel movement. This has been present for about 6 weeks. Previously he reports waking up, having a bowel movement, having breakfast, then another bowel movement 30 minutes thereafter. No other issues throughout the day or complaints. However, in the past 6 weeks he awakes with a pressure sensation in the rectum. Feels the need to have a bowel movement or passed gas. 2 bowel movements thereafter within the next 30 minutes. He continues with a constant sensation of rectal pressure. He denies bleeding or mucus. No abdominal pain. He is due to see his urologist a few months.  REVIEW OF SYSTEMS:  All non-GI ROS negative except for muscle cramps, anxiety, sinus and allergy  Past Medical History:  Diagnosis Date  . Allergy   . Hypertension   . Kidney stone 1983  . Prostate cancer ([place])   . Thyroid disease    hyperactive on meds    Past Surgical History:  Procedure Laterality Date  . COLONOSCOPY  2007   [place], [place]  . POLYPECTOMY  2007   [place], [place] BIOPSY     2016 2018  . SKIN CANCER EXCISION  01/2017   basal cell, scalp, face, right arm, left shouder, left ear, chest    Social History Allen Jackson  reports that he has never smoked. He has never used smokeless tobacco. He reports that he drinks about 0.6 oz of alcohol per week. He reports that he does not use drugs.  family history includes Colon cancer (age of onset: 59) in his mother; Colon polyps in his mother;  Hypertension in his father.  Allergies  Allergen Reactions  . Lisinopril     cough hallucinations   . Chlorthalidone Other (See Comments)    Increased urination & cramps in legs       PHYSICAL EXAMINATION: Vital signs: BP (!) 142/84   Pulse 76   Ht 5\' 9"  (1.753 m)   Wt 198 lb (89.8 kg)   BMI 29.24 kg/m   Constitutional: generally well-appearing, no acute distress Psychiatric: alert and oriented x3, cooperative Eyes: extraocular movements intact, anicteric, conjunctiva pink Mouth: oral pharynx moist, no lesions Neck: supple no lymphadenopathy Cardiovascular: heart regular rate and rhythm, no murmur Lungs: clear to auscultation bilaterally Abdomen: soft, nontender, nondistended, no obvious ascites, no peritoneal signs, normal bowel sounds, no organomegaly Rectal:smooth prostate. No mass or tenderness. Cannot elicit a pressure sensation. Heme positive stool Extremities: no lower extremity edema bilaterally Skin: no lesions on visible extremities Neuro: No focal deficits. Cranial nerves intact  ASSESSMENT:  #1. 6 week history of rectal pressure and slight change in bowel habits. Hemoccult-positive stool. Rule out mild proctitis. Rule out prostatitis #2. History of adenomatous colon polyps. Multiple complete colonoscopies. Last examination October 2018 #3. History prostate cancer under active surveillance   PLAN:  #1. Flexible sigmoidoscopy.If abnormal, address. If normal, recommend that he see his urologist  25 minutes spent face-to-face with the patient. Greater than 50% a time use for counseling regarding his rectal pressure and  change in bowel habits. We reviewed potential causes, workup plans, and follow-up plans based on findings

## 2018-05-24 NOTE — Patient Instructions (Signed)
You have been scheduled for a Flexible Sigmoidoscopy. Please follow written instructions given to you at your visit today. If you use inhalers (even only as needed), please bring them with you on the day of your procedure. Your physician has requested that you go to www.startemmi.com and enter the access code given to you at your visit today. This web site gives a general overview about your procedure. However, you should still follow specific instructions given to you by our office regarding your preparation for the procedure.

## 2018-05-30 ENCOUNTER — Encounter: Payer: Self-pay | Admitting: Internal Medicine

## 2018-06-13 ENCOUNTER — Encounter: Payer: Self-pay | Admitting: Internal Medicine

## 2018-07-04 ENCOUNTER — Other Ambulatory Visit: Payer: Medicare Other | Admitting: Internal Medicine

## 2019-04-11 ENCOUNTER — Telehealth: Payer: Self-pay | Admitting: Cardiovascular Disease

## 2019-04-11 NOTE — Telephone Encounter (Signed)
smartphone/ consent/ my chart/ pre reg completed °

## 2019-04-15 ENCOUNTER — Other Ambulatory Visit: Payer: Self-pay

## 2019-04-15 ENCOUNTER — Telehealth (INDEPENDENT_AMBULATORY_CARE_PROVIDER_SITE_OTHER): Payer: Medicare Other | Admitting: Cardiovascular Disease

## 2019-04-15 VITALS — BP 140/75 | HR 67 | Ht 69.0 in

## 2019-04-15 DIAGNOSIS — E0520 Thyrotoxicosis with toxic multinodular goiter without thyrotoxic crisis or storm: Secondary | ICD-10-CM | POA: Diagnosis not present

## 2019-04-15 DIAGNOSIS — I10 Essential (primary) hypertension: Secondary | ICD-10-CM | POA: Diagnosis not present

## 2019-04-15 DIAGNOSIS — C61 Malignant neoplasm of prostate: Secondary | ICD-10-CM | POA: Diagnosis not present

## 2019-04-15 MED ORDER — HYDROCHLOROTHIAZIDE 12.5 MG PO CAPS: 12.5000 mg | ORAL_CAPSULE | ORAL | 3 refills | Status: DC | PRN

## 2019-04-15 NOTE — Patient Instructions (Signed)
Medication Instructions:  Start HCTZ 12.5 mg as needed for blood pressure.  If you need a refill on your cardiac medications before your next appointment, please call your pharmacy.   Follow-Up: At [facility], you and your health needs are our priority.  As part of our continuing mission to provide you with exceptional heart care, we have created designated Provider Care Teams.  These Care Teams include your primary Cardiologist (physician) and Advanced Practice Providers (APPs -  Physician Assistants and Nurse Practitioners) who all work together to provide you with the care you need, when you need it. You will need a follow up appointment in 6 months.  Please call our office 2 months in advance to schedule this appointment.  You may see Dr.Kelly or one of the following Advanced Practice Providers on your designated Care Team: Almyra Deforest, [place] . Fabian Sharp, PA-C

## 2019-04-15 NOTE — Progress Notes (Signed)
Virtual Visit via Video Note   This visit type was conducted due to national recommendations for restrictions regarding the COVID-19 Pandemic (e.g. social distancing) in an effort to limit this patient's exposure and mitigate transmission in our community.  Due to his co-morbid illnesses, this patient is at least at moderate risk for complications without adequate follow up.  This format is felt to be most appropriate for this patient at this time.  All issues noted in this document were discussed and addressed.  A limited physical exam was performed with this format.  Please refer to the patient's chart for his consent to telehealth for [facility].   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 In May 2012 an echo Doppler study showed normal systolic function and diastolic function, mild mitral regurgitation, trace tricuspid regurgitation, as well as mild aortic valve sclerosis.  A nuclear perfusion study revealed normal perfusion.  He has been on losartan at 50 mg for a long time.    Due to further blood pressure elevation, several years ago his dose was increased to 100 mg.  Remotely he had been followed by Dr. Jenetta Loges in [place], but now his primary physician is Dr. Sabra Heck in [place].  He had seen a urologist who is following his prostate CA is in [place] at [facility], Dr. Byrd Hesselbach. He is also being followed for hyperthyroidism by an endocrinologist in [place].    I have not seen him in 3 years.  He has  been seeing Dr. Sabra Heck and will be initiating radiation therapy for prostate CA by Dr. Talmage Nap at [facility] radiation oncology.  In April 2020, he was started on irbesartan 150 mg daily.  His blood pressures at the time were in the 1 40-1 50 range.  On May 6 his dose was increased to 300 mg daily.  He has continued to be on amlodipine 10 mg.  He still notes that his blood pressures typically are in the 130s but there have been a couple of instances where his blood pressure has gotten into the 120s but at times still gets up to 140.  Because of concerns of his blood pressure he scheduled this appointment for reassessment.    [name] denies any episodes of chest pain.  He remains active.  As result of the COVID pandemic, he has not been able to be as active as he had in the past and is looking forward to resumption of swimming which she had been doing regularly up until March when pools were closed.  He will be undergoing repeat laboratory with his primary MD in the very near future.  The patient does not have symptoms concerning for COVID-19 infection (fever, chills, cough, or new shortness of breath).    Past Medical History:  Diagnosis Date  . Allergy   . Hypertension   . Kidney stone 1983  . Prostate cancer ([place])   . Thyroid disease    hyperactive on meds   Past Surgical History:  Procedure  Laterality Date  . COLONOSCOPY  2007   [place], [place]  . POLYPECTOMY  2007   [place], [place] BIOPSY     2016 2018  . SKIN CANCER EXCISION  01/2017   basal cell, scalp, face, right arm, left shouder, left ear, chest     Current Meds  Medication Sig  . amLODipine (NORVASC) 10 MG tablet Take 10 mg by mouth daily.  . fluticasone (FLONASE) 50 MCG/ACT nasal spray Place 2 sprays into both nostrils daily.   . irbesartan (AVAPRO) 300 MG tablet Take 1 tablet by mouth daily.  . methimazole (TAPAZOLE) 5 MG tablet Take 5 mg by mouth daily.  . Multiple Vitamin (MULTIVITAMIN) tablet Take 1  tablet by mouth daily.       Allergies:   Ipratropium; Escitalopram oxalate; Lisinopril; and Chlorthalidone   Social History   Tobacco Use  . Smoking status: Never Smoker  . Smokeless tobacco: Never Used  Substance Use Topics  . Alcohol use: Yes    Alcohol/week: 1.0 standard drinks    Types: 1 Glasses of wine per week    Comment: daily  . Drug use: No     Family Hx: The patient's family history includes Colon cancer (age of onset: 65) in his mother; Colon polyps in his mother; Hypertension in his father. There is no history of Esophageal cancer, Stomach cancer, or Rectal cancer.  ROS:   Please see the history of present illness.    Negative  for fever chills night sweats.  No cough.  No change in sense of taste or smell. No change in vision or hearing. No recent pneumonia.  No wheezing No chest pain or palpitation History of multi-nodular goiter treated with with methimazole followed by his endocrinologist Prostate CA, planning to undergo radiation treatments No significant leg swelling.  Transient mild ankle edema Leaping well.  No awareness of snoring. All other systems reviewed and are negative.   Prior CV studies:   The following studies were reviewed today:  N/A  Labs/Other Tests and Data Reviewed:    EKG:  An ECG dated 11/17/2015 was personally reviewed today and demonstrated: Normal sinus rhythm at 87 bpm.  Nonspecific ST changes.  Recent Labs: No results found for requested labs within last 8760 hours.   Recent Lipid Panel No results found for: CHOL, TRIG, HDL, CHOLHDL, LDLCALC, LDLDIRECT  Wt Readings from Last 3 Encounters:  05/24/18 198 lb (89.8 kg)  09/01/17 202 lb (91.6 kg)  08/18/17 202 lb (91.6 kg)     Objective:    Vital Signs:  BP 140/75   Pulse 67   Ht 5\' 9"  (1.753 m)   BMI 29.24 kg/m    He is well-developed and well-nourished in no acute distress. Respirations are normal and nonlabored HEENT was unremarkable No neck vein distention He  denied any tenderness to his chest wall with palpation. There was no audible wheezing There is no abdominal tenderness. He denied significant edema today. He denied any tremor There were no neurologic symptoms He had a normal affect and mood.   ASSESSMENT & PLAN:    1. Essential hypertension: Most recently, Mr. Strutz has been on amlodipine 10 mg in addition to irbesartan which was started in April at 150 mg and due to no significant change was further titrated to 300 mg daily on Mar 20, 2019.  He states his blood pressure typically runs in the 130s occasionally 140s but there have been several instances where it may have  been in the upper 120s.  At times he notices some transient swelling around his ankles but typically he does not have edema.  A lengthy discussion with him and in particular discussed the hypertensive guidelines with optimal blood pressure less than 120/80, stage I hypertension beginning at 130/80 in stage II hypertension at 140/90.  We discussed for every 20/10 increase in blood pressure and there is a doubling of cardiovascular risk.  He plans to resume swimming which will be improved aerobic benefit which she has been doing over the past several months due to the COVID-19 pandemic and stay -in orders.  I have recommended initiation of HCTZ to start at possibly every other day and for him to monitor blood pressure with goal of blood pressure consistently less than 300 systolically.  He will be undergoing repeat blood work with his primary physician. 2. 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  Followed by his endocrinologist 3. Prostate CA: To initiate localized radiation therapy at [facility].  COVID-19 Education: The signs and symptoms of COVID-19 were discussed with the patient and how to seek care for testing (follow up with PCP or arrange E-visit).  The importance of social distancing was discussed today.  Time:   Today, I have spent 25 minutes with the patient with  telehealth technology discussing the above problems.     Medication Adjustments/Labs and Tests Ordered: Current medicines are reviewed at length with the patient today.  Concerns regarding medicines are outlined above.   Tests Ordered: No orders of the defined types were placed in this encounter.   Medication Changes: Meds ordered this encounter  Medications  . hydrochlorothiazide (MICROZIDE) 12.5 MG capsule    Sig: Take 1 capsule (12.5 mg total) by mouth as needed (Blood pressure).    Dispense:  90 capsule    Refill:  3    Disposition:  Follow up 6 months  Signed, Shelva Majestic, MD  04/16/2019 5:32 PM    [place]

## 2020-12-22 ENCOUNTER — Encounter: Payer: Self-pay | Admitting: Internal Medicine

## 2021-12-02 ENCOUNTER — Encounter: Payer: Self-pay | Admitting: Internal Medicine

## 2022-02-09 ENCOUNTER — Encounter: Payer: Medicare Other | Admitting: Internal Medicine

## 2024-04-13 ENCOUNTER — Encounter: Payer: Self-pay | Admitting: Hematology and Oncology

## 2024-04-16 NOTE — Progress Notes (Signed)
 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 Rosaline Coma   CHIEF COMPLAINTS/PURPOSE OF CONSULTATION:  " IgG Lambda Monoclonal Gammopathy "  HISTORY OF PRESENTING ILLNESS:  Allen Jackson 78 y.o. male with medical history significant for hypertension, prostate cancer status post radiation, and thyroid  disease who presents for evaluation of a monoclonal gammopathy.  On review of the previous records Mr. Johannes had labs drawn on 03/14/2024 with an SPEP that showed an M protein of 0.77, IgG lambda specificity.  Due to concern for these findings the patient was referred to hematology for further evaluation and management.  On exam today Mr. Guevara reports that he currently follows with urology for his history of prostate cancer.  He was diagnosed in 2016 with radiation being in 2019/2020.  He reports that it does not cause some issue with urination he feels like his bladder is not entirely empty.  He notes he does not have any bone pain or back pain.  His energy and appetite are quite strong.  He is physically active and does golf.  On further discussion he reports that his mother had colon cancer and pancreatic cancer in his father had heart disease and hypothyroidism.  He reports his sister had breast cancer but he is not in close contact with her.  He has 1 healthy child.  He reports he is a non-smoker.  He drinks about 1 to 2 glasses of wine or 1-2 beers daily.  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  Otherwise he denies any fevers, chills, sweats, nausea, vomiting or diarrhea.  A full 10 point ROS is otherwise negative.  MEDICAL HISTORY:   Past Medical History:  Diagnosis Date   Allergy    Hypertension    Kidney stone 1983   Prostate cancer (HCC)    Thyroid  disease    hyperactive on meds    SURGICAL HISTORY: Past Surgical History:  Procedure Laterality Date   COLONOSCOPY  2007   [place], [place]   POLYPECTOMY  2007   [place], [place]   PROSTATE BIOPSY     2016 2018   SKIN CANCER EXCISION  01/2017   basal cell, scalp, face, right arm, left shouder, left ear, chest    SOCIAL HISTORY: Social History   Socioeconomic History   Marital status: Married    Spouse name: Not on file   Number of children: Not on file   Years of education: Not on file   Highest education level: Not on file  Occupational History   Not on file  Tobacco Use   Smoking status: Never   Smokeless tobacco: Never  Vaping Use   Vaping status: Never Used  Substance and Sexual Activity   Alcohol use: Yes    Alcohol/week: 1.0 standard drink of alcohol    Types: 1 Glasses of wine per week    Comment: daily   Drug use: No   Sexual activity: Not on file  Other Topics Concern   Not on file  Social History Narrative   Not on file   Social Drivers of Health   Financial Resource Strain: Low Risk  (06/30/2023)   Received from [facility]  Overall Financial Resource Strain (CARDIA)    Difficulty of Paying Living Expenses: Not very hard  Food Insecurity: No Food Insecurity (06/30/2023)   Received from [facility]   Hunger Vital Sign    Worried About Running Out of Food in the Last Year: Never true    Ran Out of Food in the Last Year: Never true  Transportation Needs: No Transportation Needs (06/30/2023)   Received from [facility] - Transportation    In the past 12 months, has lack of transportation kept you from medical appointments or from getting medications?: No    Lack of Transportation (Non-Medical): No  Physical Activity: Sufficiently Active (06/30/2023)   Received  from [facility]   Exercise Vital Sign    Days of Exercise per Week: 3 days    Minutes of Exercise per Session: 60 min  Stress: No Stress Concern Present (06/30/2023)   Received from [facility] - Occupational Stress Questionnaire    Feeling of Stress : Only a little  Social Connections: Moderately Isolated (06/30/2023)   Received from [facility]   Social Connection and Isolation Panel [NHANES]    Frequency of Communication with Friends and Family: Never    Frequency of Social Gatherings with Friends and Family: Once a week    Attends Religious Services: 1 to 4 times per year    Active Member of [employer] or Organizations: No    Attends Engineer, structural: Never    Marital Status: Married  Catering manager Violence: Not on file    FAMILY HISTORY: Family History  Problem Relation Age of Onset   Colon cancer Mother 47   Colon polyps Mother    Hypertension Father    Esophageal cancer Neg Hx    Stomach cancer Neg Hx    Rectal cancer Neg Hx     ALLERGIES:  is allergic to ipratropium, acetazolamide, cephalexin, escitalopram oxalate, gabapentin, hydrochlorothiazide , lisinopril, and chlorthalidone.  MEDICATIONS:  Current Outpatient Medications  Medication Sig Dispense Refill   atorvastatin (LIPITOR) 10 MG tablet Take 1 tablet by mouth daily.     brimonidine (ALPHAGAN) 0.2 % ophthalmic solution Place 2 drops into the right eye 2 (two) times daily.     fexofenadine (ALLEGRA) 180 MG tablet Take 180 mg by mouth as needed.     ketorolac (ACULAR) 0.5 % ophthalmic solution Place 1 drop into the right eye 2 (two) times daily.     timolol (TIMOPTIC) 0.5 % ophthalmic solution Place 1 drop into the right eye 2 (two) times daily.     amLODipine (NORVASC) 10 MG tablet Take 10 mg by mouth daily.     fluticasone (FLONASE) 50 MCG/ACT nasal spray Place 2 sprays into both nostrils daily.       ibuprofen (ADVIL) 200 MG tablet Take 200 mg by mouth every 6 (six) hours as needed.     irbesartan (AVAPRO) 300 MG tablet Take 1 tablet by mouth daily.     MAGNESIUM PO Take 1 tablet by mouth every morning.     methimazole (TAPAZOLE) 5 MG tablet Take 5 mg by mouth daily.     Multiple Vitamin (MULTIVITAMIN) tablet Take 1 tablet by mouth daily.       prednisoLONE acetate (PRED FORTE) 1 % ophthalmic suspension Place 1 drop into the right eye 4 (four) times daily.     sildenafil (REVATIO) 20 MG tablet Take 20  mg by mouth daily as needed.      No current facility-administered medications for this visit.    REVIEW OF SYSTEMS:   Constitutional: ( - ) fevers, ( - )  chills , ( - ) night sweats Eyes: ( - ) blurriness of vision, ( - ) double vision, ( - ) watery eyes Ears, nose, mouth, throat, and face: ( - ) mucositis, ( - ) sore throat Respiratory: ( - ) cough, ( - ) dyspnea, ( - ) wheezes Cardiovascular: ( - ) palpitation, ( - ) chest discomfort, ( - ) lower extremity swelling Gastrointestinal:  ( - ) nausea, ( - ) heartburn, ( - ) change in bowel habits Skin: ( - ) abnormal skin rashes Lymphatics: ( - ) new lymphadenopathy, ( - ) easy bruising Neurological: ( - ) numbness, ( - ) tingling, ( - ) new weaknesses Behavioral/Psych: ( - ) mood change, ( - ) new changes  All other systems were reviewed with the patient and are negative.  PHYSICAL EXAMINATION:  Vitals:   04/17/24 0930  BP: (!) 148/74  Pulse: 60  Resp: 15  Temp: 98.2 F (36.8 C)  SpO2: 100%   Filed Weights   04/17/24 0930  Weight: 191 lb 1.6 oz (86.7 kg)    GENERAL: well appearing elderly Caucasian male in NAD  SKIN: skin color, texture, turgor are normal, no rashes or significant lesions EYES: conjunctiva are pink and non-injected, sclera clear LUNGS: clear to auscultation and percussion with normal breathing effort HEART: regular rate & rhythm and no murmurs and no lower extremity edema Musculoskeletal: no cyanosis of  digits and no clubbing  PSYCH: alert & oriented x 3, fluent speech NEURO: no focal motor/sensory deficits  LABORATORY DATA:  I have reviewed the data as listed    Latest Ref Rng & Units 04/17/2024   10:06 AM  CBC  WBC 4.0 - 10.5 K/uL 3.7   Hemoglobin 13.0 - 17.0 g/dL 60.4   Hematocrit 54.0 - 52.0 % 37.7   Platelets 150 - 400 K/uL 178        Latest Ref Rng & Units 04/17/2024   10:06 AM  CMP  Glucose 70 - 99 mg/dL 981   BUN 8 - 23 mg/dL 17   Creatinine 1.91 - 1.24 mg/dL 4.78   Sodium 295 - 621 mmol/L 138   Potassium 3.5 - 5.1 mmol/L 4.4   Chloride 98 - 111 mmol/L 106   CO2 22 - 32 mmol/L 27   Calcium 8.9 - 10.3 mg/dL 9.4   Total Protein 6.5 - 8.1 g/dL 8.1   Total Bilirubin 0.0 - 1.2 mg/dL 1.0   Alkaline Phos 38 - 126 U/L 108   AST 15 - 41 U/L 21   ALT 0 - 44 U/L 21      ASSESSMENT & PLAN Allen Jackson 78 y.o. male with medical history significant for hypertension, prostate cancer status post radiation, and thyroid  disease who presents for evaluation of a monoclonal gammopathy.  After review of the labs, review of the records, and discussion with the patient the patients findings are most consistent with a monoclonal colopathy requiring further workup.  Monoclonal Gammopathies are a group of medical conditions defined by the presence of a monoclonal protein (an M protein) in the blood or urine. Monoclonal gammopathies include monoclonal gammopathy of unknown significance (MGUS), Monoclonal gammopathies of renal or neurological significance,  smoldering multiple myeloma (SMM), multiple myeloma (MM), AL amyloidosis, and Waldenstrom macroglobulinemia. The goal of the  initial workup is to determine which monoclonal gammopathy a patient has. The workup consists of evaluating protein in the serum (with serum protein electrophoresis (SPEP) and serum free light chains) , evaluating protein in the urine (UPEP), and evaluation of the skeleton (DG Bone Met Survey) to assure no lytic lesions.  Baseline bloodwork includes CMP and CBC. If no CRAB criteria or high risk criteria are noted then the diagnosis is MGUS. MGUS must be followed with bloodwork periodically to assure it does not convert to multiple myeloma (occurs to approximately 1% of patients per year). If there are CRAB criteria or high risk features (such as elevated serum free light chain ratio (taking into account renal function), a non IgG M protein, or M protein >1.5) then a bone marrow biopsy must be pursued.    #IgG Lambda Monoclonal Gammopathy of Undetermined Significance --today will order an SPEP, UPEP, SFLC and beta 2 microglobulin --additionally will collect new baseline CBC, CMP, and LDH --recommend a metastatic bone survey to assess for lytic lesions --will consider the need for a bone marrow biopsy pending the above results --RTC in 6 months or sooner if intervention is required.    Orders Placed This Encounter  Procedures   DG Bone Survey Met    Standing Status:   Future    Expected Date:   04/24/2024    Expiration Date:   04/17/2025    Reason for Exam (SYMPTOM  OR DIAGNOSIS REQUIRED):   MGUS, please assess for lytic lesions    Preferred imaging location?:   [facility]   CBC with Differential (Cancer Center Only)    Standing Status:   Future    Number of Occurrences:   1    Expiration Date:   04/17/2025   CMP (Cancer Center only)    Standing Status:   Future    Number of Occurrences:   1    Expiration Date:   04/17/2025   Lactate dehydrogenase (LDH)    Standing Status:   Future    Number of Occurrences:   1    Expiration Date:   04/17/2025   Multiple Myeloma Panel (SPEP&IFE w/QIG)    Standing Status:   Future    Number of Occurrences:   1    Expiration Date:   04/17/2025   Kappa/lambda light chains    Standing Status:   Future    Number of Occurrences:   1    Expiration Date:   04/17/2025   Beta 2 microglobulin    Standing Status:   Future    Number of Occurrences:   1    Expiration Date:    04/17/2025   24-Hr Ur UPEP/UIFE/Light Chains/TP    Standing Status:   Future    Expiration Date:   04/17/2025    All questions were answered. The patient knows to call the clinic with any problems, questions or concerns.  A total of more than 60 minutes were spent on this encounter with face-to-face time and non-face-to-face time, including preparing to see the patient, ordering tests and/or medications, counseling the patient and coordination of care as outlined above.   Rogerio Clay, MD Department of Hematology/Oncology [facility] Phone: [phone] Pager: [phone] Email: [email or web address]  04/21/2024 1:06 PM

## 2024-04-17 ENCOUNTER — Inpatient Hospital Stay: Attending: Hematology and Oncology | Admitting: Hematology and Oncology

## 2024-04-17 ENCOUNTER — Inpatient Hospital Stay

## 2024-04-17 VITALS — BP 148/74 | HR 60 | Temp 98.2°F | Resp 15 | Wt 191.1 lb

## 2024-04-17 DIAGNOSIS — Z8546 Personal history of malignant neoplasm of prostate: Secondary | ICD-10-CM | POA: Insufficient documentation

## 2024-04-17 DIAGNOSIS — Z800 Family history of malignant neoplasm of digestive organs: Secondary | ICD-10-CM | POA: Diagnosis not present

## 2024-04-17 DIAGNOSIS — D472 Monoclonal gammopathy: Secondary | ICD-10-CM | POA: Diagnosis present

## 2024-04-17 DIAGNOSIS — Z803 Family history of malignant neoplasm of breast: Secondary | ICD-10-CM | POA: Diagnosis not present

## 2024-04-17 DIAGNOSIS — Z923 Personal history of irradiation: Secondary | ICD-10-CM | POA: Insufficient documentation

## 2024-04-17 LAB — CBC WITH DIFFERENTIAL (CANCER CENTER ONLY)
Abs Immature Granulocytes: 0 10*3/uL (ref 0.00–0.07)
Basophils Absolute: 0 10*3/uL (ref 0.0–0.1)
Basophils Relative: 1 %
Eosinophils Absolute: 0.1 10*3/uL (ref 0.0–0.5)
Eosinophils Relative: 2 %
HCT: 37.7 % — ABNORMAL LOW (ref 39.0–52.0)
Hemoglobin: 13.5 g/dL (ref 13.0–17.0)
Immature Granulocytes: 0 %
Lymphocytes Relative: 22 %
Lymphs Abs: 0.8 10*3/uL (ref 0.7–4.0)
MCH: 34.5 pg — ABNORMAL HIGH (ref 26.0–34.0)
MCHC: 35.8 g/dL (ref 30.0–36.0)
MCV: 96.4 fL (ref 80.0–100.0)
Monocytes Absolute: 0.4 10*3/uL (ref 0.1–1.0)
Monocytes Relative: 10 %
Neutro Abs: 2.4 10*3/uL (ref 1.7–7.7)
Neutrophils Relative %: 65 %
Platelet Count: 178 10*3/uL (ref 150–400)
RBC: 3.91 MIL/uL — ABNORMAL LOW (ref 4.22–5.81)
RDW: 12.8 % (ref 11.5–15.5)
WBC Count: 3.7 10*3/uL — ABNORMAL LOW (ref 4.0–10.5)
nRBC: 0 % (ref 0.0–0.2)

## 2024-04-17 LAB — CMP (CANCER CENTER ONLY)
ALT: 21 U/L (ref 0–44)
AST: 21 U/L (ref 15–41)
Albumin: 4.5 g/dL (ref 3.5–5.0)
Alkaline Phosphatase: 108 U/L (ref 38–126)
Anion gap: 5 (ref 5–15)
BUN: 17 mg/dL (ref 8–23)
CO2: 27 mmol/L (ref 22–32)
Calcium: 9.4 mg/dL (ref 8.9–10.3)
Chloride: 106 mmol/L (ref 98–111)
Creatinine: 1.23 mg/dL (ref 0.61–1.24)
GFR, Estimated: >60 mL/min (ref >60)
Glucose, Bld: 103 mg/dL — ABNORMAL HIGH (ref 70–99)
Potassium: 4.4 mmol/L (ref 3.5–5.1)
Sodium: 138 mmol/L (ref 135–145)
Total Bilirubin: 1 mg/dL (ref 0.0–1.2)
Total Protein: 8.1 g/dL (ref 6.5–8.1)

## 2024-04-17 LAB — LACTATE DEHYDROGENASE: LDH: 151 U/L (ref 98–192)

## 2024-04-17 NOTE — Progress Notes (Signed)
 Met patient with Dr Rosaline Coma, introduced myself and explained my role in their care. Will keep in touch with patient regarding tests and procedures.

## 2024-04-18 LAB — KAPPA/LAMBDA LIGHT CHAINS
Kappa free light chain: 21.1 mg/L — ABNORMAL HIGH (ref 3.3–19.4)
Kappa, lambda light chain ratio: 0.12 — ABNORMAL LOW (ref 0.26–1.65)
Lambda free light chains: 178.2 mg/L — ABNORMAL HIGH (ref 5.7–26.3)

## 2024-04-18 LAB — BETA 2 MICROGLOBULIN, SERUM: Beta-2 Microglobulin: 2.1 mg/L (ref 0.6–2.4)

## 2024-04-19 LAB — MULTIPLE MYELOMA PANEL, SERUM
Albumin SerPl Elph-Mcnc: 3.8 g/dL (ref 2.9–4.4)
Albumin/Glob SerPl: 1.1 (ref 0.7–1.7)
Alpha 1: 0.2 g/dL (ref 0.0–0.4)
Alpha2 Glob SerPl Elph-Mcnc: 0.7 g/dL (ref 0.4–1.0)
B-Globulin SerPl Elph-Mcnc: 0.9 g/dL (ref 0.7–1.3)
Gamma Glob SerPl Elph-Mcnc: 1.7 g/dL (ref 0.4–1.8)
Globulin, Total: 3.5 g/dL (ref 2.2–3.9)
IgA: 93 mg/dL (ref 61–437)
IgG (Immunoglobin G), Serum: 1842 mg/dL — ABNORMAL HIGH (ref 603–1613)
IgM (Immunoglobulin M), Srm: 114 mg/dL (ref 15–143)
M Protein SerPl Elph-Mcnc: 0.9 g/dL — ABNORMAL HIGH
Total Protein ELP: 7.3 g/dL (ref 6.0–8.5)

## 2024-04-23 ENCOUNTER — Telehealth: Payer: Self-pay | Admitting: Hematology and Oncology

## 2024-04-25 DIAGNOSIS — D472 Monoclonal gammopathy: Secondary | ICD-10-CM | POA: Diagnosis not present

## 2024-04-26 ENCOUNTER — Encounter: Payer: Self-pay | Admitting: *Deleted

## 2024-04-26 ENCOUNTER — Telehealth: Payer: Self-pay | Admitting: *Deleted

## 2024-04-26 ENCOUNTER — Ambulatory Visit (HOSPITAL_COMMUNITY)
Admission: RE | Admit: 2024-04-26 | Discharge: 2024-04-26 | Disposition: A | Discharge status: Home / Self Care | Source: Ambulatory Visit | Attending: Hematology and Oncology | Admitting: Hematology and Oncology

## 2024-04-26 DIAGNOSIS — D472 Monoclonal gammopathy: Secondary | ICD-10-CM | POA: Insufficient documentation

## 2024-04-26 NOTE — Telephone Encounter (Signed)
 Received vm message from pt. He is requesting that his records form here be fax'd to [facility] Hematology dept @ [phone] as he is transferring his care there. He lives in [place] and this is much closer to him. Records were fax'd with the exception of his 24 urine and his Bone survey as those results are not available at this time.  Those records will be fax'd at a later date once they are available.

## 2024-04-29 NOTE — Progress Notes (Signed)
 Patient requested that his records so far be sent over to [facility]. Per patient he and his wife realized that the travel here from [facility] is just too much for them. He stated that he just panicked after getting the lab work from [facility] and originally was not able to get an appointment at [facility] until October, so he came here. He will be having further testing at [facility], RN, Dr Les Rao nurse faxed records to [facility] on 04/26/24. We wish Mr Lacher well in his further care and will send remaining results when they become available.

## 2024-04-30 LAB — UPEP/UIFE/LIGHT CHAINS/TP, 24-HR UR
% BETA, Urine: 0 %
ALPHA 1 URINE: 0 %
Albumin, U: 100 %
Alpha 2, Urine: 0 %
Free Kappa Lt Chains,Ur: 5.75 mg/L (ref 1.17–86.46)
Free Kappa/Lambda Ratio: 3.06 (ref 1.83–14.26)
Free Lambda Lt Chains,Ur: 1.88 mg/L (ref 0.27–15.21)
GAMMA GLOBULIN URINE: 0 %
Total Protein, Urine-Ur/day: 94 mg/(24.h) (ref 30–150)
Total Protein, Urine: 5.1 mg/dL
Total Volume: 1850

## 2024-05-24 ENCOUNTER — Other Ambulatory Visit (HOSPITAL_COMMUNITY)

## 2024-05-24 ENCOUNTER — Ambulatory Visit (HOSPITAL_COMMUNITY)

## 2024-07-25 ENCOUNTER — Ambulatory Visit: Admitting: Internal Medicine

## 2024-08-03 ENCOUNTER — Telehealth: Payer: Self-pay | Admitting: Hematology and Oncology

## 2024-08-03 NOTE — Telephone Encounter (Signed)
 Spoke with Allen Jackson to assure that he had follow-up locally.  He reports that he has an upcoming visit on 09/25/2024 at [facility] for his MGUS.  If he would develop any new or worsening symptoms or if there would be any changes before his clinic visit there we would be happy to see him and evaluate further.  Patient voiced understanding of our recommendations moving forward.  Norleen IVAR Kidney, MD Department of Hematology/Oncology [facility] Phone: [phone] Pager: [phone] Email: [email or web address]

## 2024-09-26 ENCOUNTER — Encounter: Payer: Self-pay | Admitting: Hematology and Oncology
